# Patient Record
Sex: Male | Born: 1975 | Race: White | Hispanic: No | Marital: Married | Smoking: Never smoker
Health system: Southern US, Community
[De-identification: ages and names within clinical notes are randomized; demographics above are authoritative.]

## PROBLEM LIST (undated history)

## (undated) HISTORY — PX: OTHER SURGICAL HISTORY: SHX169

## (undated) HISTORY — PX: APPENDECTOMY: SHX54

## (undated) HISTORY — PX: CHOLECYSTECTOMY: SHX55

## (undated) HISTORY — PX: COLONOSCOPY WITH PROPOFOL: SHX5780

---

## 2009-06-02 ENCOUNTER — Ambulatory Visit: Payer: Self-pay | Admitting: Dermatology

## 2009-07-01 ENCOUNTER — Ambulatory Visit: Payer: Self-pay | Admitting: Specialist

## 2012-02-15 IMAGING — CT CT CHEST W/ CM
1 of 2 series · 15 of 29 positions shown, 19 images · IV contrast (agent unspecified)
Comparison: None

REASON FOR EXAM: ABN  CXR bil hilar nodes  and positive  for sarcoidosis
COMMENTS:

PROCEDURE:     CT  - CT CHEST WITH CONTRAST  - July 01, 2009  [DATE]
RESULT:     Indication: Hilar adenopathy
TECHNIQUE: Multiple axial images of the chest are obtained with 75 mL of
Esovue-P6E intravenous contrast.

[Series 2: soft tissue · axial · 0.74mm/px · z∈[-406,-86]mm · 15 of 70 slices shown, 19 images]
[im 3/70  mediastinal]
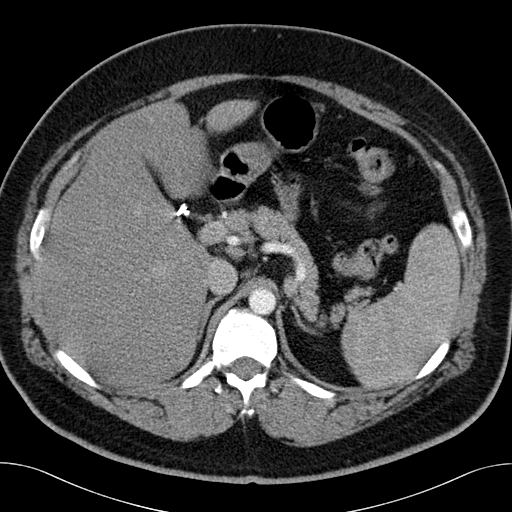
[im 3/70  lung]
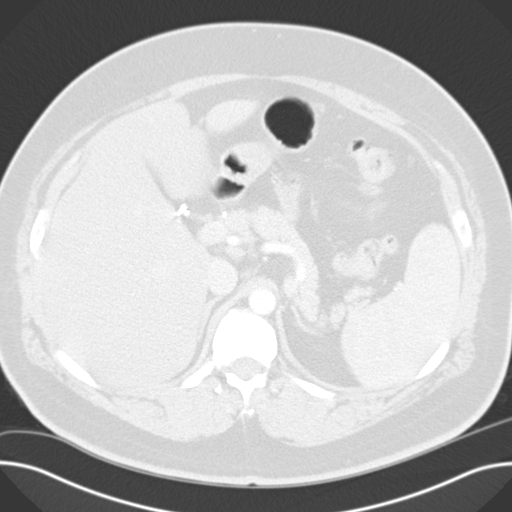
[im 9/70  lung]
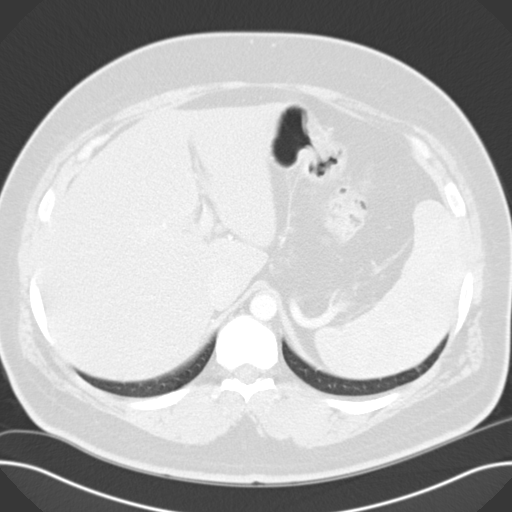
[im 15/70  lung]
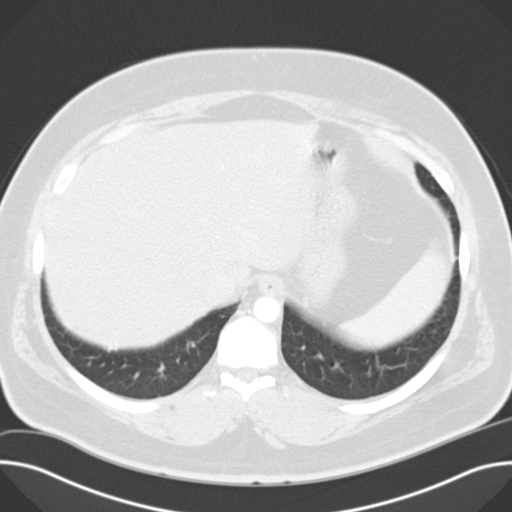
[im 18/70  lung]
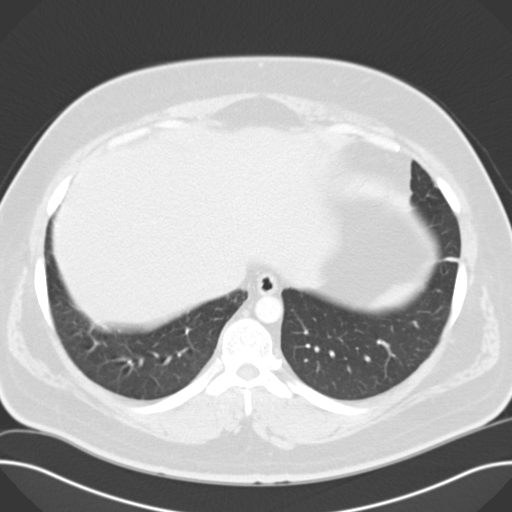
[im 24/70  mediastinal]
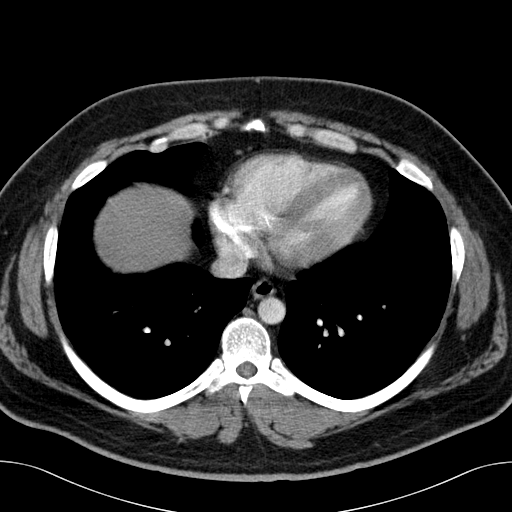
[im 24/70  lung]
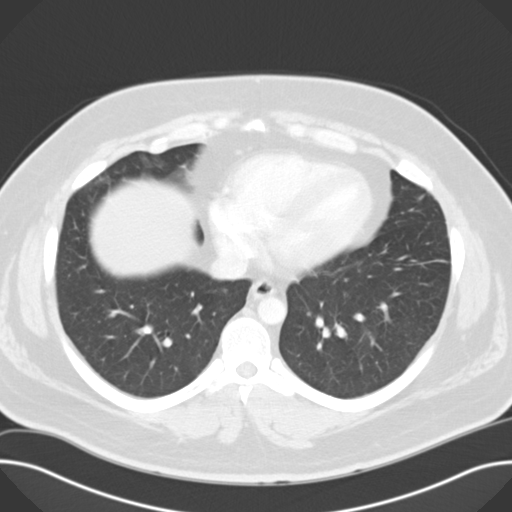
[im 26/70  lung]
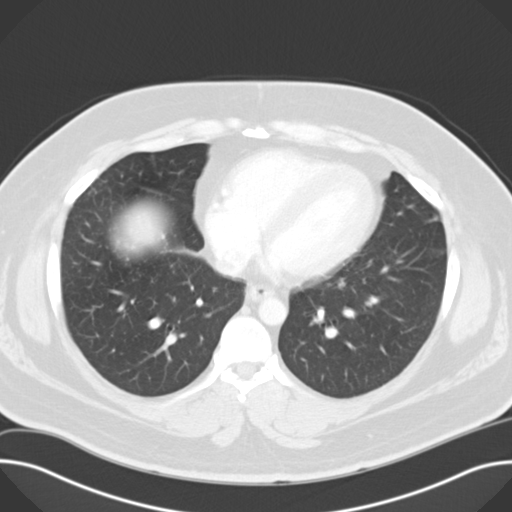
[im 31/70  lung]
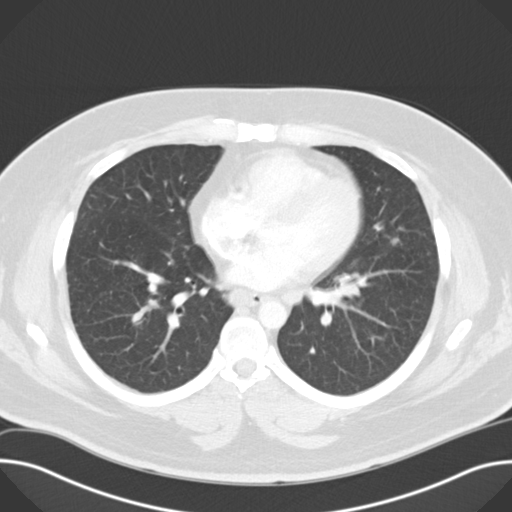
[im 35/70  lung]
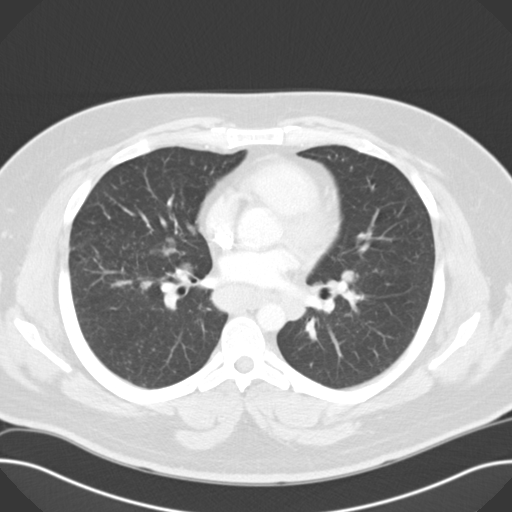
[im 38/70  mediastinal]
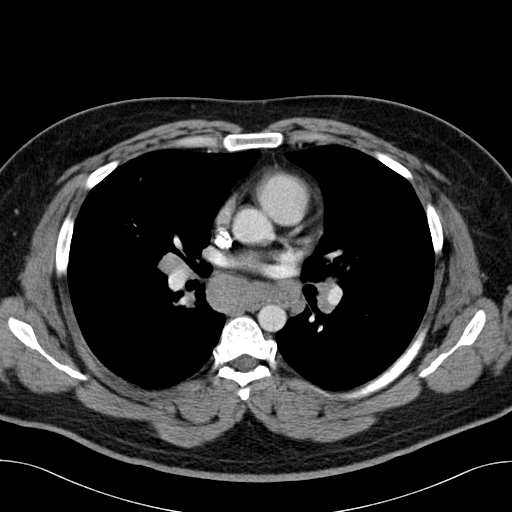
[im 38/70  lung]
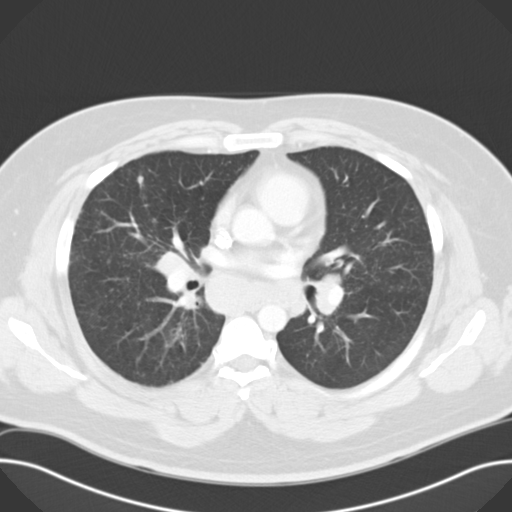
[im 44/70  lung]
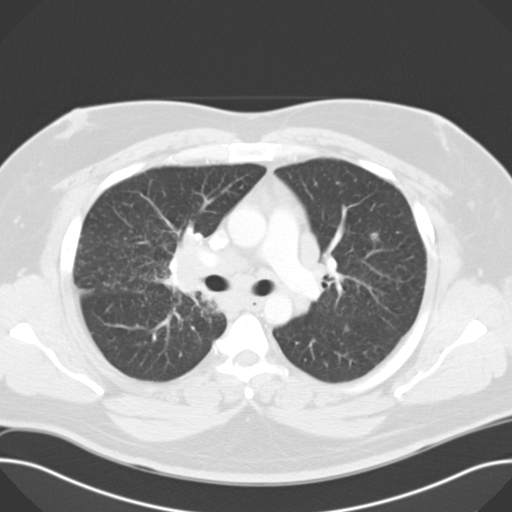
[im 47/70  lung]
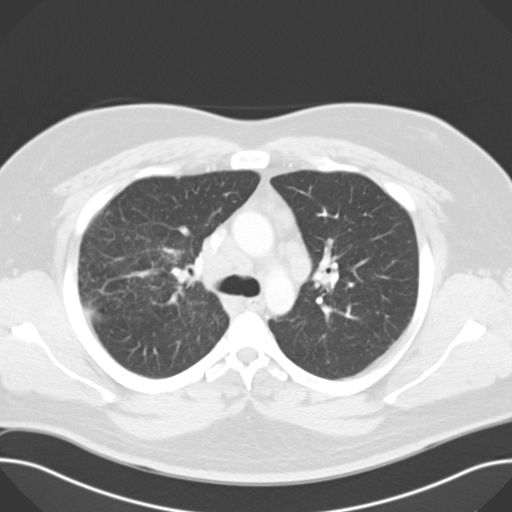
[im 52/70  lung]
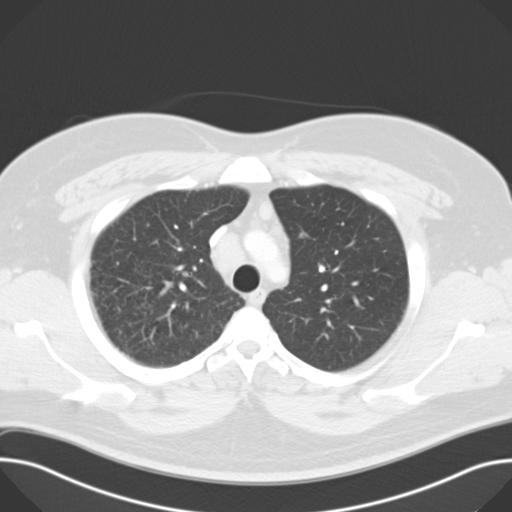
[im 58/70  mediastinal]
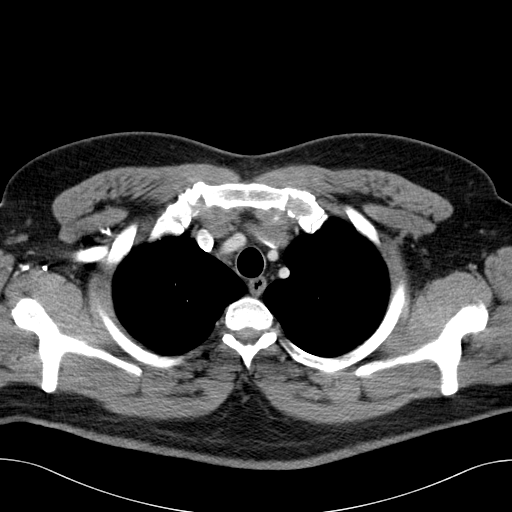
[im 58/70  lung]
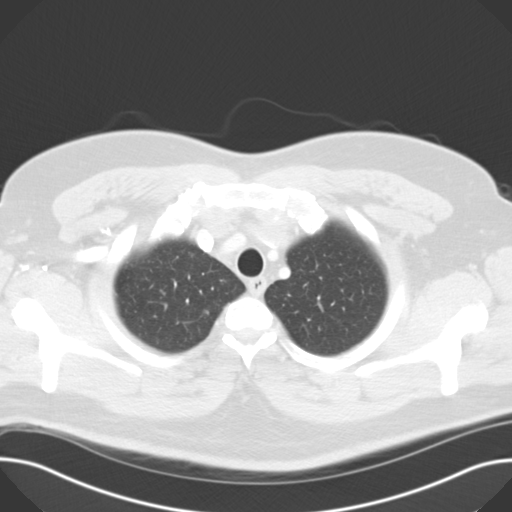
[im 61/70  lung]
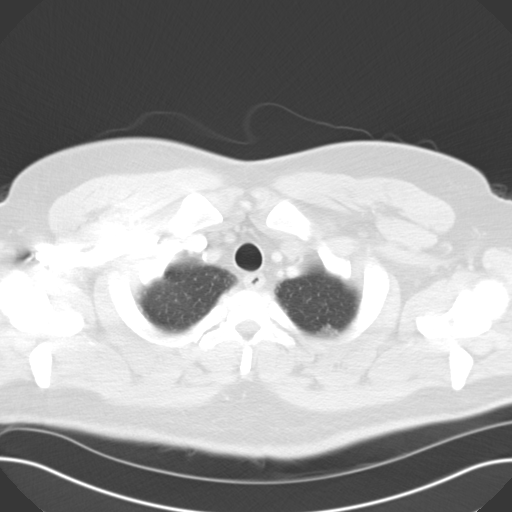
[im 67/70  lung]
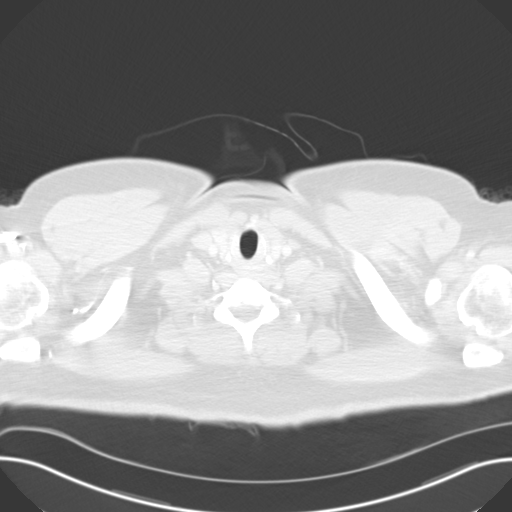

[15 of 29 positions shown; findings below may reference images not displayed]

FINDINGS: The central airways are patent. There are innumerable peribronchial vascular
nodules involving the left upper lobe, superior segment left lower lobe,
superior segment of the right lower lobe and the right upper lobe. There are
3 larger pulmonary nodules in the anterior segment of the right upper lobe
measuring 5 mm. There is a left upper lobe pulmonary nodule measuring 6 mm.
There is no pleural effusion or pneumothorax.

There is mediastinal and bilateral hilar lymphadenopathy.  The subcarinal
lymph node measures 3 cm in short axis. The right hilar lymph node measures
2.3 cm in short axis. The largest left hilar lymph node measures 17 mm in
short axis. The largest right lower paratracheal lymph node measures 19 mm
in short axis. The largest prevascular lymph node measures 11 mm in short
axis. The largest right upper paratracheal lymph node measures 17 mm in
short axis.

The heart size is normal. There is no pericardial effusion. The thoracic
aorta is normal in caliber.  .

Review of bone windows demonstrates no focal lytic or sclerotic lesions.

Limited noncontrast images of the upper abdomen were obtained. The adrenal
glands appear normal. The liver is diffusely low in attenuation most
consistent with hepatic steatosis.
IMPRESSION: Mediastinal and hilar lymphadenopathy with bilateral peribronchovascular
nodules involving the bilateral upper lobes and superior segments of the
lower lobes. These findings are compatible with the patient's history of
sarcoidosis.

## 2014-11-17 ENCOUNTER — Encounter: Payer: Self-pay | Admitting: Emergency Medicine

## 2014-11-17 ENCOUNTER — Other Ambulatory Visit: Payer: Self-pay

## 2014-11-17 ENCOUNTER — Emergency Department
Admission: EM | Admit: 2014-11-17 | Discharge: 2014-11-17 | Disposition: A | Discharge status: Home / Self Care | Payer: Self-pay | Attending: Emergency Medicine | Admitting: Emergency Medicine

## 2014-11-17 DIAGNOSIS — R42 Dizziness and giddiness: Secondary | ICD-10-CM | POA: Insufficient documentation

## 2014-11-17 LAB — CBC
HCT: 43.3 % (ref 40.0–52.0)
Hemoglobin: 15 g/dL (ref 13.0–18.0)
MCH: 29.8 pg (ref 26.0–34.0)
MCHC: 34.5 g/dL (ref 32.0–36.0)
MCV: 86.4 fL (ref 80.0–100.0)
PLATELETS: 238 10*3/uL (ref 150–440)
RBC: 5.01 MIL/uL (ref 4.40–5.90)
RDW: 12.8 % (ref 11.5–14.5)
WBC: 10.7 10*3/uL — AB (ref 3.8–10.6)

## 2014-11-17 LAB — BASIC METABOLIC PANEL
Anion gap: 9 (ref 5–15)
BUN: 11 mg/dL (ref 6–20)
CALCIUM: 8.8 mg/dL — AB (ref 8.9–10.3)
CO2: 23 mmol/L (ref 22–32)
CREATININE: 0.72 mg/dL (ref 0.61–1.24)
Chloride: 98 mmol/L — ABNORMAL LOW (ref 101–111)
Glucose, Bld: 167 mg/dL — ABNORMAL HIGH (ref 65–99)
Potassium: 3.8 mmol/L (ref 3.5–5.1)
SODIUM: 130 mmol/L — AB (ref 135–145)

## 2014-11-17 MED ORDER — MECLIZINE HCL 25 MG PO TABS
25.0000 mg | ORAL_TABLET | Freq: Once | ORAL | Status: AC
  Administered 2014-11-17: 25 mg via ORAL
  Filled 2014-11-17 (×2): qty 1

## 2014-11-17 MED ORDER — MECLIZINE HCL 25 MG PO TABS: 25.0000 mg | ORAL_TABLET | Freq: Three times a day (TID) | ORAL | Status: AC | PRN

## 2014-11-17 MED ORDER — ONDANSETRON 8 MG PO TBDP
8.0000 mg | ORAL_TABLET | Freq: Once | ORAL | Status: AC
  Administered 2014-11-17: 8 mg via ORAL

## 2014-11-17 MED ORDER — ONDANSETRON 8 MG PO TBDP
ORAL_TABLET | ORAL | Status: DC
Start: 2014-11-17 — End: 2014-11-17
  Filled 2014-11-17: qty 1

## 2014-11-17 NOTE — ED Provider Notes (Signed)
[facility] Emergency Department Provider Note  ____________________________________________  Time seen: Approximately 205 PM  I have reviewed the triage vital signs and the nursing notes.   HISTORY  Chief Complaint Nausea; Dehydration; and Dizziness    HPI Timothy Daugherty is a 39 y.o. male with a history of sarcoidosis who presents today with dizziness since this morning. He said that he was unable to get off the couch this morning because every time he tried to get up and move he spirits extreme dizziness. He also had one episode of vomiting secondary to the dizziness prior to arrival. He thinks that his symptoms may be related to dehydration because he works outside. However, he has been trying to stay hydrated and drank 2 large bottles of water yesterday after work. He denies any recent illness. Denies any ringing or roaring in his ears. Denies any family history of stroke. Denies taking any chronic medications.Says feels improved at this time. Given nausea medications in triage and now no longer nauseous. Denies any pain at this time.   History reviewed. No pertinent past medical history.  There are no active problems to display for this patient.   History reviewed. No pertinent past surgical history.  No current outpatient prescriptions on file.  Allergies Review of patient's allergies indicates no known allergies.  History reviewed. No pertinent family history.  Social History Social History  Substance Use Topics  . Smoking status: Never Smoker   . Smokeless tobacco: Never Used  . Alcohol Use: No    Review of Systems Constitutional: No fever/chills Eyes: No visual changes. ENT: No sore throat. Cardiovascular: Denies chest pain. Respiratory: Denies shortness of breath. Gastrointestinal: No abdominal pain.  No diarrhea.  No constipation. Genitourinary: Negative for dysuria. Musculoskeletal: Negative for back pain. Skin: Negative for  rash. Neurological: Negative for headaches, focal weakness or numbness.  10-point ROS otherwise negative.  ____________________________________________   PHYSICAL EXAM:  VITAL SIGNS: ED Triage Vitals  Enc Vitals Group     BP 11/17/14 1037 156/95 mmHg     Pulse Rate 11/17/14 1037 83     Resp 11/17/14 1037 20     Temp 11/17/14 1037 97.7 F (36.5 C)     Temp Source 11/17/14 1037 Oral     SpO2 11/17/14 1037 96 %     Weight 11/17/14 1037 300 lb (136.079 kg)     Height 11/17/14 1037 6\' 1"  (1.854 m)     Head Cir --      Peak Flow --      Pain Score 11/17/14 1358 1     Pain Loc --      Pain Edu? --      Excl. in GC? --     Constitutional: Alert and oriented. Well appearing and in no acute distress. Eyes: Conjunctivae are normal. PERRL. EOMI. Head: Atraumatic. Bilateral tympanic membranes are normal. Nose: No congestion/rhinnorhea. Mouth/Throat: Mucous membranes are moist.  Oropharynx non-erythematous. Neck: No stridor.   Cardiovascular: Normal rate, regular rhythm. Grossly normal heart sounds.  Good peripheral circulation. Respiratory: Normal respiratory effort.  No retractions. Lungs CTAB. Gastrointestinal: Soft and nontender. No distention. No abdominal bruits. No CVA tenderness. Musculoskeletal: No lower extremity tenderness nor edema.  No joint effusions. Neurologic:  Normal speech and language. No gross focal neurologic deficits are appreciated. No gait instability. No ataxia on finger to nose testing. No nystagmus. Able to ambulate without assistance with a normal gait. Skin:  Skin is warm, dry and intact. No rash noted.  Psychiatric: Mood and affect are normal. Speech and behavior are normal.  ____________________________________________   LABS (all labs ordered are listed, but only abnormal results are displayed)  Labs Reviewed  BASIC METABOLIC PANEL - Abnormal; Notable for the following:    Sodium 130 (*)    Chloride 98 (*)    Glucose, Bld 167 (*)    Calcium 8.8  (*)    All other components within normal limits  CBC - Abnormal; Notable for the following:    WBC 10.7 (*)    All other components within normal limits  URINALYSIS COMPLETEWITH MICROSCOPIC ([facility] ONLY)  CBG MONITORING, ED   ____________________________________________  EKG  ED ECG REPORT I, Arelia Longest, the attending physician, personally viewed and interpreted this ECG.   Date: 11/17/2014  EKG Time: 1042  Rate: 78  Rhythm: normal sinus rhythm  Axis: Left axis  Intervals:none  ST&T Change: No ST elevations or depressions. No abnormal T-wave inversions.  ____________________________________________  RADIOLOGY   ____________________________________________   PROCEDURES    ____________________________________________   INITIAL IMPRESSION / ASSESSMENT AND PLAN / ED COURSE  Pertinent labs & imaging results that were available during my care of the patient were reviewed by me and considered in my medical decision making (see chart for details).  ----------------------------------------- 3:41 PM on 11/17/2014 -----------------------------------------  Patient with symptomatic improvement after fluids and meclizine. Able to ambulate now without any dizziness. When vigorously moves head side to side does have mild dizziness. Symptoms are consistent with peripheral vertigo. I did consider that this could be a complication of his sarcoidosis. However, the patient's symptoms are continually improving. If he had a brain lesion secondary to sarcoidosis he would not have this waxing and waning characteristic. Furthermore, with sudden onset, nausea vomiting and strong association with movement and he has symptoms consistent with peripheral vertigo. He will follow-up with his primary care doctor. I urged him if his symptoms worsen that he should return to the emergency department immediately and at that time would be a candidate for imaging. We'll discharge to  home. ____________________________________________   FINAL CLINICAL IMPRESSION(S) / ED DIAGNOSES  Acute vertigo. Initial visit.    Myrna Blazer, MD 11/17/14 [phone]

## 2014-11-17 NOTE — Discharge Instructions (Signed)
Dizziness   Dizziness means you feel unsteady or lightheaded. You might feel like you are going to pass out (faint).  HOME CARE   · Drink enough fluids to keep your pee (urine) clear or pale yellow.  · Take your medicines exactly as told by your doctor. If you take blood pressure medicine, always stand up slowly from the lying or sitting position. Hold on to something to steady yourself.  · If you need to stand in one place for a long time, move your legs often. Tighten and relax your leg muscles.  · Have someone stay with you until you feel okay.  · Do not drive or use heavy machinery if you feel dizzy.  · Do not drink alcohol.  GET HELP RIGHT AWAY IF:   · You feel dizzy or lightheaded and it gets worse.  · You feel sick to your stomach (nauseous), or you throw up (vomit).  · You have trouble talking or walking.  · You feel weak or have trouble using your arms, hands, or legs.  · You cannot think clearly or have trouble forming sentences.  · You have chest pain, belly (abdominal) pain, sweating, or you are short of breath.  · Your vision changes.  · You are bleeding.  · You have problems from your medicine that seem to be getting worse.  MAKE SURE YOU:   · Understand these instructions.  · Will watch your condition.  · Will get help right away if you are not doing well or get worse.  Document Released: 03/08/2011 Document Revised: 06/11/2011 Document Reviewed: 03/08/2011  Exit[employer]® Patient Information ©2015 [employer]. This information is not intended to replace advice given to you by your health care provider. Make sure you discuss any questions you have with your health care provider.

## 2014-11-17 NOTE — ED Notes (Signed)
Dizziness, nausea, near syncope,this am.works outside

## 2014-11-17 NOTE — ED Notes (Signed)
MD at bedside., Dr.Schaevitz  

## 2014-11-17 NOTE — ED Notes (Signed)
Patient to ED with c/o waking this morning with dizziness this morning, thought he might possibly be dehydrated. Began having some nausea and weakness.

## 2022-05-16 ENCOUNTER — Ambulatory Visit
Admission: RE | Admit: 2022-05-16 | Discharge: 2022-05-16 | Disposition: A | Discharge status: Home / Self Care | Payer: Self-pay | Attending: Surgery | Admitting: Surgery

## 2022-05-16 ENCOUNTER — Ambulatory Visit: Payer: Self-pay | Admitting: Certified Registered"

## 2022-05-16 ENCOUNTER — Encounter
Admission: RE | Disposition: A | Discharge status: Home / Self Care | Payer: Self-pay | Source: Home / Self Care | Attending: Surgery

## 2022-05-16 ENCOUNTER — Encounter: Payer: Self-pay | Admitting: Surgery

## 2022-05-16 DIAGNOSIS — Z1211 Encounter for screening for malignant neoplasm of colon: Secondary | ICD-10-CM | POA: Insufficient documentation

## 2022-05-16 DIAGNOSIS — K640 First degree hemorrhoids: Secondary | ICD-10-CM | POA: Insufficient documentation

## 2022-05-16 DIAGNOSIS — Z6841 Body Mass Index (BMI) 40.0 and over, adult: Secondary | ICD-10-CM | POA: Insufficient documentation

## 2022-05-16 DIAGNOSIS — Z800 Family history of malignant neoplasm of digestive organs: Secondary | ICD-10-CM | POA: Insufficient documentation

## 2022-05-16 DIAGNOSIS — K5730 Diverticulosis of large intestine without perforation or abscess without bleeding: Secondary | ICD-10-CM | POA: Insufficient documentation

## 2022-05-16 HISTORY — PX: COLONOSCOPY WITH PROPOFOL: SHX5780

## 2022-05-16 SURGERY — COLONOSCOPY WITH PROPOFOL
Anesthesia: General

## 2022-05-16 MED ORDER — PROPOFOL 500 MG/50ML IV EMUL
INTRAVENOUS | Status: DC | PRN
  Administered 2022-05-16: 150 ug/kg/min via INTRAVENOUS

## 2022-05-16 MED ORDER — PROPOFOL 10 MG/ML IV BOLUS
INTRAVENOUS | Status: DC | PRN
  Administered 2022-05-16: 70 mg via INTRAVENOUS

## 2022-05-16 MED ORDER — PROPOFOL 1000 MG/100ML IV EMUL
INTRAVENOUS | Status: AC
  Filled 2022-05-16: qty 100

## 2022-05-16 MED ORDER — FENTANYL CITRATE (PF) 100 MCG/2ML IJ SOLN
INTRAMUSCULAR | Status: DC | PRN
  Administered 2022-05-16: 100 ug via INTRAVENOUS

## 2022-05-16 MED ORDER — LIDOCAINE HCL (CARDIAC) PF 100 MG/5ML IV SOSY
PREFILLED_SYRINGE | INTRAVENOUS | Status: DC | PRN
  Administered 2022-05-16: 100 mg via INTRAVENOUS

## 2022-05-16 MED ORDER — SODIUM CHLORIDE 0.9 % IV SOLN: INTRAVENOUS | Status: DC

## 2022-05-16 MED ORDER — FENTANYL CITRATE (PF) 100 MCG/2ML IJ SOLN
INTRAMUSCULAR | Status: AC
  Filled 2022-05-16: qty 2

## 2022-05-16 MED ORDER — GLYCOPYRROLATE 0.2 MG/ML IJ SOLN
INTRAMUSCULAR | Status: DC | PRN
  Administered 2022-05-16: .2 mg via INTRAVENOUS

## 2022-05-16 MED ORDER — DEXMEDETOMIDINE HCL IN NACL 80 MCG/20ML IV SOLN
INTRAVENOUS | Status: DC | PRN
  Administered 2022-05-16: 4 ug via BUCCAL
  Administered 2022-05-16: 8 ug via BUCCAL

## 2022-05-16 NOTE — H&P (Signed)
Subjective:  CC: Encounter for screening colonoscopy [Z12.11]  HPI: Timothy Daugherty is a 47 y.o. male who was referred by Earleen Newport for evaluation of above. Recent discomfort with BM, but no bleeding. Here for colonoscopy scheduling due to strong family hx.  Past Medical History: none reported  Past Surgical History: colonoscopy for abdominal pain in distant past, lap chole  Family History: older brother with stage IV Colon CA noted in 70s, younger brother with hx of mult adenomatous polyps  Social History: reports that he has never smoked. He has never used smokeless tobacco. He reports that he does not drink alcohol and does not use drugs.  Current Medications: has a current medication list which includes the following prescription(s): loratadine, multivitamin, omega-3-dha-epa-fish oil, and ranitidine.  Allergies: No Known Allergies  ROS: A 15 point review of systems was performed and pertinent positives and negatives noted in HPI  Objective:   BP (!) 155/95  Pulse 78  Ht 185.4 cm (6' 1"$ )  Wt (!) 147.4 kg (325 lb)  BMI 42.88 kg/m  Constitutional : No distress, cooperative, alert Lymphatics/Throat: Supple with no lymphadenopathy Respiratory: Clear to auscultation bilaterally Cardiovascular: Regular rate and rhythm Gastrointestinal: Soft, non-tender, non-distended, no organomegaly. Musculoskeletal: Steady gait and movement Skin: Cool and moist Psychiatric: Normal affect, non-agitated, not confused    LABS: N/a  RADS: N/a  Assessment:   Encounter for screening colonoscopy [Z12.11] Family history of colon CA  Plan:   1. Encounter for screening colonoscopy [Z12.11] R/b/a discussed. Risks include bleeding, perforation. Benefits include diagnostic, curative procedure if needed. Alternatives include continued observation. Pt verbalized understanding.  2. Patient has elected to proceed with surgical treatment. Procedure will be  scheduled.  labs/images/medications/previous chart entries reviewed personally and relevant changes/updates noted above.  Electronically signed by Benjamine Sprague, DO at 04/18/2022 3:37 PM EST

## 2022-05-16 NOTE — Anesthesia Postprocedure Evaluation (Signed)
Anesthesia Post Note  Patient: Timothy Daugherty  Procedure(s) Performed: COLONOSCOPY WITH PROPOFOL  Patient location during evaluation: PACU Anesthesia Type: General Level of consciousness: awake and oriented Pain management: pain level controlled Respiratory status: spontaneous breathing and nonlabored ventilation Cardiovascular status: stable Anesthetic complications: no   There were no known notable events for this encounter.   Last Vitals:  Vitals:   05/16/22 0940 05/16/22 0950  BP: 117/72 103/74  Pulse: 77 85  Resp: 17 18  Temp: (!) 35.7 C   SpO2: 95% 96%    Last Pain:  Vitals:   05/16/22 0940  TempSrc: Temporal  PainSc: Asleep                 VAN STAVEREN,Eldoris Beiser

## 2022-05-16 NOTE — Transfer of Care (Signed)
Immediate Anesthesia Transfer of Care Note  Patient: Timothy Daugherty  Procedure(s) Performed: COLONOSCOPY WITH PROPOFOL  Patient Location: PACU  Anesthesia Type:General  Level of Consciousness: drowsy  Airway & Oxygen Therapy: Patient Spontanous Breathing  Post-op Assessment: Report given to RN and Post -op Vital signs reviewed and stable  Post vital signs: Reviewed and stable  Last Vitals:  Vitals Value Taken Time  BP    Temp    Pulse    Resp    SpO2      Last Pain:  Vitals:   05/16/22 0827  TempSrc: Temporal  PainSc: 0-No pain         Complications: No notable events documented.
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
## 2022-05-16 NOTE — Interval H&P Note (Signed)
History and Physical Interval Note:  05/16/2022 8:35 AM  Timothy Daugherty  has presented today for surgery, with the diagnosis of Encounter for screening colonoscopy Z12.11.  The various methods of treatment have been discussed with the patient and family. After consideration of risks, benefits and other options for treatment, the patient has consented to  Procedure(s): COLONOSCOPY WITH PROPOFOL (N/A) as a surgical intervention.  The patient's history has been reviewed, patient examined, no change in status, stable for surgery.  I have reviewed the patient's chart and labs.  Questions were answered to the patient's satisfaction.    Family history of colon CA  Jazline Cumbee Lysle Pearl

## 2022-05-16 NOTE — Op Note (Signed)
[facility] Patient Name: Timothy Daugherty Procedure Date: 05/16/2022 8:40 AM MRN: [id number] Account #: [id number] Date of Birth: 1975/08/10 Admit Type: Outpatient Age: 47 Room: [name] ENDO ROOM 3 Gender: Male Note Status: Finalized Instrument Name: Jasper Riling [id number] Procedure:             Colonoscopy Indications:           Screening for colorectal malignant neoplasm Providers:             Benjamine Sprague MD, MD Referring MD:          No Local Md, MD (Referring MD) Medicines:             Propofol per Anesthesia Complications:         No immediate complications. Procedure:             Pre-Anesthesia Assessment:                        - After reviewing the risks and benefits, the patient                         was deemed in satisfactory condition to undergo the                         procedure in an ambulatory setting.                        After obtaining informed consent, the colonoscope was                         passed under direct vision. Throughout the procedure,                         the patient's blood pressure, pulse, and oxygen                         saturations were monitored continuously. The                         Colonoscope was introduced through the anus and                         advanced to the the cecum, identified by the ileocecal                         valve. The colonoscopy was somewhat difficult due to a                         redundant colon. Successful completion of the                         procedure was aided by applying abdominal pressure.                         The patient tolerated the procedure well. The quality                         of the bowel preparation was good. Findings:      The perianal and digital rectal examinations were normal.  A few medium-mouthed diverticula were found in the sigmoid colon.      Non-bleeding internal hemorrhoids were found during retroflexion. The       hemorrhoids were  Grade I (internal hemorrhoids that do not prolapse). Impression:            - Diverticulosis in the sigmoid colon.                        - Non-bleeding internal hemorrhoids.                        - No specimens collected. Recommendation:        - Repeat colonoscopy in 5 years for screening purposes. Procedure Code(s):     --- Professional ---                        [phone], Colonoscopy, flexible; diagnostic, including                         collection of specimen(s) by brushing or washing, when                         performed (separate procedure) Diagnosis Code(s):     --- Professional ---                        Z12.11, Encounter for screening for malignant neoplasm                         of colon                        K64.0, First degree hemorrhoids                        K57.30, Diverticulosis of large intestine without                         perforation or abscess without bleeding CPT copyright 2022 American [employer] Association. All rights reserved. The codes documented in this report are preliminary and upon coder review may  be revised to meet current compliance requirements. Dr. Sheppard Penton, MD Benjamine Sprague MD, MD 05/16/2022 9:28:54 AM This report has been signed electronically. Number of Addenda: 0 Note Initiated On: 05/16/2022 8:40 AM Scope Withdrawal Time: 0 hours 9 minutes 46 seconds  Total Procedure Duration: 0 hours 35 minutes 1 second  Estimated Blood Loss:  Estimated blood loss: none.      [facility]

## 2022-05-17 ENCOUNTER — Encounter: Payer: Self-pay | Admitting: Surgery

## 2023-05-28 ENCOUNTER — Other Ambulatory Visit: Payer: Self-pay

## 2023-05-28 ENCOUNTER — Emergency Department
Admission: EM | Admit: 2023-05-28 | Discharge: 2023-05-28 | Disposition: A | Discharge status: Home / Self Care | Payer: Self-pay | Attending: Emergency Medicine | Admitting: Emergency Medicine

## 2023-05-28 ENCOUNTER — Emergency Department: Payer: Self-pay

## 2023-05-28 DIAGNOSIS — R079 Chest pain, unspecified: Secondary | ICD-10-CM | POA: Insufficient documentation

## 2023-05-28 DIAGNOSIS — R1012 Left upper quadrant pain: Secondary | ICD-10-CM | POA: Insufficient documentation

## 2023-05-28 LAB — TROPONIN I (HIGH SENSITIVITY)
Troponin I (High Sensitivity): 3 ng/L (ref <18)
Troponin I (High Sensitivity): 3 ng/L (ref <18)

## 2023-05-28 LAB — CBC
HCT: 44.7 % (ref 39.0–52.0)
Hemoglobin: 15.4 g/dL (ref 13.0–17.0)
MCH: 29.5 pg (ref 26.0–34.0)
MCHC: 34.5 g/dL (ref 30.0–36.0)
MCV: 85.6 fL (ref 80.0–100.0)
Platelets: 252 10*3/uL (ref 150–400)
RBC: 5.22 MIL/uL (ref 4.22–5.81)
RDW: 12.4 % (ref 11.5–15.5)
WBC: 10.7 10*3/uL — ABNORMAL HIGH (ref 4.0–10.5)
nRBC: 0 % (ref 0.0–0.2)

## 2023-05-28 LAB — BASIC METABOLIC PANEL
Anion gap: 10 (ref 5–15)
BUN: 22 mg/dL — ABNORMAL HIGH (ref 6–20)
CO2: 24 mmol/L (ref 22–32)
Calcium: 9.3 mg/dL (ref 8.9–10.3)
Chloride: 102 mmol/L (ref 98–111)
Creatinine, Ser: 0.67 mg/dL (ref 0.61–1.24)
GFR, Estimated: >60 mL/min (ref >60)
Glucose, Bld: 160 mg/dL — ABNORMAL HIGH (ref 70–99)
Potassium: 4 mmol/L (ref 3.5–5.1)
Sodium: 136 mmol/L (ref 135–145)

## 2023-05-28 LAB — D-DIMER, QUANTITATIVE: D-Dimer, Quant: <0.27 ug{FEU}/mL (ref 0.00–0.50)

## 2023-05-28 MED ORDER — KETOROLAC TROMETHAMINE 15 MG/ML IJ SOLN
15.0000 mg | Freq: Once | INTRAMUSCULAR | Status: AC
  Administered 2023-05-28: 15 mg via INTRAVENOUS
  Filled 2023-05-28: qty 1

## 2023-05-28 MED ORDER — NITROGLYCERIN 0.4 MG SL SUBL
0.4000 mg | SUBLINGUAL_TABLET | SUBLINGUAL | Status: DC | PRN
  Administered 2023-05-28: 0.4 mg via SUBLINGUAL
  Filled 2023-05-28: qty 1

## 2023-05-28 MED ORDER — KETOROLAC TROMETHAMINE 30 MG/ML IJ SOLN: 30.0000 mg | Freq: Once | INTRAMUSCULAR | Status: DC

## 2023-05-28 MED ORDER — OXYCODONE-ACETAMINOPHEN 5-325 MG PO TABS: 1.0000 | ORAL_TABLET | Freq: Four times a day (QID) | ORAL | 0 refills | Status: AC | PRN

## 2023-05-28 MED ORDER — LIDOCAINE VISCOUS HCL 2 % MT SOLN
15.0000 mL | Freq: Once | OROMUCOSAL | Status: AC
  Administered 2023-05-28: 15 mL via ORAL
  Filled 2023-05-28: qty 15

## 2023-05-28 MED ORDER — OXYCODONE-ACETAMINOPHEN 5-325 MG PO TABS
1.0000 | ORAL_TABLET | Freq: Once | ORAL | Status: AC
  Administered 2023-05-28: 1 via ORAL
  Filled 2023-05-28: qty 1

## 2023-05-28 MED ORDER — ALUM & MAG HYDROXIDE-SIMETH 200-200-20 MG/5ML PO SUSP
30.0000 mL | Freq: Once | ORAL | Status: AC
  Administered 2023-05-28: 30 mL via ORAL
  Filled 2023-05-28: qty 30

## 2023-05-28 NOTE — ED Notes (Signed)
 See triage note.Presents with chest pain States pain started last pm He felt like it may have been a muscle spasm  He took a muscle relaxer  Min to no relief  Also having pain into left arm and has radiated in between shoulders

## 2023-05-28 NOTE — ED Triage Notes (Signed)
 Pt reports chest pain that began this morning aprox 45 min ago, pt states pain is sharp on left side radiating into left arm. Pt denies accompanying symptoms. Pt denies cardiac hx.

## 2023-05-28 NOTE — ED Provider Notes (Signed)
 [facility] Provider Note    Event Date/Time   First MD Initiated Contact with Patient 05/28/23 [phone]     (approximate)   History   Chest Pain   HPI  Timothy Daugherty is a 48 y.o. male who presents to the emergency department today because of concerns for chest pain.  Chest pain started yesterday evening.  Located in his left lower chest there is some radiation up to his left shoulder.  He did take a muscle relaxer last night which did give some relief.  However the pain returned this morning.  He denies any associated nausea or vomiting. States the pain is worse with deep breaths. Denies similar pain in the past. No unusual exertion recently.     Physical Exam   Triage Vital Signs: ED Triage Vitals  Encounter Vitals Group     BP 05/28/23 0644 (!) 170/107     Systolic BP Percentile --      Diastolic BP Percentile --      Pulse Rate 05/28/23 0644 85     Resp 05/28/23 0644 20     Temp 05/28/23 0644 98.2 F (36.8 C)     Temp Source 05/28/23 0644 Oral     SpO2 05/28/23 0644 98 %     Weight 05/28/23 0643 (!) 318 lb (144.2 kg)     Height 05/28/23 0643 6' (1.829 m)     Head Circumference --      Peak Flow --      Pain Score 05/28/23 0643 10     Pain Loc --      Pain Education --      Exclude from Growth Chart --     Most recent vital signs: Vitals:   05/28/23 0644  BP: (!) 170/107  Pulse: 85  Resp: 20  Temp: 98.2 F (36.8 C)  SpO2: 98%   General: Awake, alert, oriented. CV:  Good peripheral perfusion. Regular rate and rhythm. Resp:  Normal effort. Lungs clear. Abd:  No distention. Minimal tenderness in the left upper quadrant.   ED Results / Procedures / Treatments   Labs (all labs ordered are listed, but only abnormal results are displayed) Labs Reviewed  BASIC METABOLIC PANEL - Abnormal; Notable for the following components:      Result Value   Glucose, Bld 160 (*)    BUN 22 (*)    All other components within normal limits  CBC -  Abnormal; Notable for the following components:   WBC 10.7 (*)    All other components within normal limits  D-DIMER, QUANTITATIVE  TROPONIN I (HIGH SENSITIVITY)  TROPONIN I (HIGH SENSITIVITY)     EKG  I, Phineas Semen, attending physician, personally viewed and interpreted this EKG  EKG Time: 0644 Rate: 85 Rhythm: Sinus rhythm Axis: LAD Intervals: qtc 442 QRS: Narrow, LVH ST changes: No ST elevation Impression: abnormal ekg    RADIOLOGY I independently interpreted and visualized the CXR. My interpretation: No pneumonia Radiology interpretation:  IMPRESSION:  Low volume chest with mild linear density attributed to atelectasis  or scarring.     PROCEDURES:  Critical Care performed: No    MEDICATIONS ORDERED IN ED: Medications - No data to display   IMPRESSION / MDM / ASSESSMENT AND PLAN / ED COURSE  I reviewed the triage vital signs and the nursing notes.  Differential diagnosis includes, but is not limited to, ACS, pneumonia, PE, pneumothorax, GERD  Patient's presentation is most consistent with acute presentation with potential threat to life or bodily function.   Patient presented to the emergency department today because of concern for chest pain. On exam lungs clear. Minimal tenderness in LUQ. Initial troponin, EKG and CXR without concerning abnormalities. Will check second troponin, given that pain is worse with deep breaths will check d-dimer as well. Will trial gi cocktail.  D-dimer and second troponin negative.  Patient did not get any relief after GI cocktail.  I then tried a nitroglycerin.  Patient did have almost a vasovagal response to this.  With his heart rate dropping as well as I believe his blood pressure.  He did become faint although he recovered quite quickly without any specific intervention.  The nitroglycerin did not appear to give the patient any relief.  I then tried some Toradol which again did not give  great relief.  He did feel better after Percocet.  At this time I do think likely patient suffering from inflammation of the musculoskeletal system.  He had negative troponins.  He had negative D-dimer.  I do think it is safe and reasonable for patient to be discharged.  He already has ointment scheduled with his primary care for later this week.      FINAL CLINICAL IMPRESSION(S) / ED DIAGNOSES   Final diagnoses:  Nonspecific chest pain     Note:  This document was prepared using Dragon voice recognition software and may include unintentional dictation errors.    Phineas Semen, MD 05/28/23 [phone]

## 2023-05-28 NOTE — ED Notes (Addendum)
 Pt family called this RN into the room stating "something is wrong (with patient)". Patient found to be pale, diaphoretic complaining of dizziness/sudden onset of generalized weakness. Patient HR dropped from 93 to 50 on monitor. This RN cycled patient's BP, lowered head of bed, and notified provider. MD Derrill Kay ordered STAT EKG and placed pt under observation following pt reporting improvement after a couple of minutes.
